# Patient Record
Sex: Male | Born: 1965 | Hispanic: No | Marital: Married | Smoking: Current every day smoker
Health system: Southern US, Community
[De-identification: ages and names within clinical notes are randomized; demographics above are authoritative.]

## PROBLEM LIST (undated history)

## (undated) DIAGNOSIS — Z9889 Other specified postprocedural states: Secondary | ICD-10-CM

## (undated) DIAGNOSIS — E7800 Pure hypercholesterolemia, unspecified: Secondary | ICD-10-CM

## (undated) DIAGNOSIS — T8859XA Other complications of anesthesia, initial encounter: Secondary | ICD-10-CM

## (undated) DIAGNOSIS — R112 Nausea with vomiting, unspecified: Secondary | ICD-10-CM

## (undated) DIAGNOSIS — M1990 Unspecified osteoarthritis, unspecified site: Secondary | ICD-10-CM

## (undated) DIAGNOSIS — N189 Chronic kidney disease, unspecified: Secondary | ICD-10-CM

## (undated) DIAGNOSIS — F32A Depression, unspecified: Secondary | ICD-10-CM

## (undated) DIAGNOSIS — F4310 Post-traumatic stress disorder, unspecified: Secondary | ICD-10-CM

## (undated) DIAGNOSIS — M109 Gout, unspecified: Secondary | ICD-10-CM

## (undated) DIAGNOSIS — R519 Headache, unspecified: Secondary | ICD-10-CM

## (undated) DIAGNOSIS — F419 Anxiety disorder, unspecified: Secondary | ICD-10-CM

## (undated) HISTORY — PX: KNEE ARTHROSCOPY: SUR90

## (undated) HISTORY — PX: COLONOSCOPY: SHX174

---

## 2020-03-21 ENCOUNTER — Other Ambulatory Visit: Payer: Self-pay | Admitting: Neurological Surgery

## 2020-03-21 DIAGNOSIS — M7130 Other bursal cyst, unspecified site: Secondary | ICD-10-CM

## 2020-03-28 ENCOUNTER — Encounter (HOSPITAL_COMMUNITY): Payer: Self-pay | Admitting: Neurological Surgery

## 2020-03-28 ENCOUNTER — Other Ambulatory Visit (HOSPITAL_COMMUNITY)
Admission: RE | Admit: 2020-03-28 | Discharge: 2020-03-28 | Disposition: A | Discharge status: Home / Self Care | Payer: No Typology Code available for payment source | Source: Ambulatory Visit | Attending: Neurological Surgery | Admitting: Neurological Surgery

## 2020-03-28 DIAGNOSIS — Z20822 Contact with and (suspected) exposure to covid-19: Secondary | ICD-10-CM | POA: Insufficient documentation

## 2020-03-28 DIAGNOSIS — Z01812 Encounter for preprocedural laboratory examination: Secondary | ICD-10-CM | POA: Insufficient documentation

## 2020-03-28 LAB — SARS CORONAVIRUS 2 (TAT 6-24 HRS): SARS Coronavirus 2: NEGATIVE

## 2020-03-28 NOTE — Progress Notes (Addendum)
Brandon Mcdaniel denies chest pain or shortness of breath. Patient was tested for Covid today and has been in quarantine since that time.  I review Brandon Mcdaniel's last office visit with his PCP- Dr. Lorre Munroe at [facility] in [place]. Brandon Mcdaniel elevated cholesterol and elevated triglycerides. Patient also has stage III Kidney disease.  PCp is following creatine numbers. Per Dr. Glendale Chard last office notes, Brandon Mcdaniel is taking Fenofribrate for elevated cholestrol, it is not on patients admission medication list.Brandon Mcdaniel said that he has been out for a while, I encouraged patient to call his PCP.  I instructed Brandon Mcdaniel to stop [name] and Multipe Vitamins and to not smoke within 24 hours of surgery.  I asked anesthesiology PA-C to review.

## 2020-03-31 ENCOUNTER — Ambulatory Visit (HOSPITAL_COMMUNITY): Payer: No Typology Code available for payment source

## 2020-03-31 ENCOUNTER — Ambulatory Visit (HOSPITAL_COMMUNITY)
Admission: RE | Admit: 2020-03-31 | Discharge: 2020-03-31 | Disposition: A | Discharge status: Home / Self Care | Payer: No Typology Code available for payment source | Attending: Neurological Surgery | Admitting: Neurological Surgery

## 2020-03-31 ENCOUNTER — Other Ambulatory Visit: Payer: Self-pay

## 2020-03-31 ENCOUNTER — Encounter (HOSPITAL_COMMUNITY): Payer: Self-pay | Admitting: Neurological Surgery

## 2020-03-31 ENCOUNTER — Ambulatory Visit (HOSPITAL_COMMUNITY): Payer: No Typology Code available for payment source | Admitting: Physician Assistant

## 2020-03-31 ENCOUNTER — Ambulatory Visit (HOSPITAL_COMMUNITY)
Admission: RE | Disposition: A | Discharge status: Home / Self Care | Payer: Self-pay | Source: Home / Self Care | Attending: Neurological Surgery

## 2020-03-31 ENCOUNTER — Ambulatory Visit (HOSPITAL_COMMUNITY): Payer: No Typology Code available for payment source | Attending: Neurological Surgery

## 2020-03-31 DIAGNOSIS — M5418 Radiculopathy, sacral and sacrococcygeal region: Secondary | ICD-10-CM | POA: Diagnosis not present

## 2020-03-31 DIAGNOSIS — M7138 Other bursal cyst, other site: Secondary | ICD-10-CM | POA: Insufficient documentation

## 2020-03-31 DIAGNOSIS — M109 Gout, unspecified: Secondary | ICD-10-CM | POA: Insufficient documentation

## 2020-03-31 DIAGNOSIS — Z7982 Long term (current) use of aspirin: Secondary | ICD-10-CM | POA: Insufficient documentation

## 2020-03-31 DIAGNOSIS — F17210 Nicotine dependence, cigarettes, uncomplicated: Secondary | ICD-10-CM | POA: Diagnosis not present

## 2020-03-31 DIAGNOSIS — M7130 Other bursal cyst, unspecified site: Secondary | ICD-10-CM

## 2020-03-31 DIAGNOSIS — Z419 Encounter for procedure for purposes other than remedying health state, unspecified: Secondary | ICD-10-CM

## 2020-03-31 HISTORY — DX: Depression, unspecified: F32.A

## 2020-03-31 HISTORY — PX: LUMBAR LAMINECTOMY/DECOMPRESSION MICRODISCECTOMY: SHX5026

## 2020-03-31 HISTORY — DX: Other specified postprocedural states: Z98.890

## 2020-03-31 HISTORY — DX: Other specified postprocedural states: R11.2

## 2020-03-31 HISTORY — DX: Pure hypercholesterolemia, unspecified: E78.00

## 2020-03-31 HISTORY — DX: Unspecified osteoarthritis, unspecified site: M19.90

## 2020-03-31 HISTORY — DX: Post-traumatic stress disorder, unspecified: F43.10

## 2020-03-31 HISTORY — DX: Gout, unspecified: M10.9

## 2020-03-31 HISTORY — DX: Anxiety disorder, unspecified: F41.9

## 2020-03-31 HISTORY — DX: Headache, unspecified: R51.9

## 2020-03-31 HISTORY — DX: Chronic kidney disease, unspecified: N18.9

## 2020-03-31 HISTORY — DX: Other complications of anesthesia, initial encounter: T88.59XA

## 2020-03-31 LAB — BASIC METABOLIC PANEL WITH GFR
Anion gap: 9 (ref 5–15)
BUN: 19 mg/dL (ref 6–20)
CO2: 21 mmol/L — ABNORMAL LOW (ref 22–32)
Calcium: 8.9 mg/dL (ref 8.9–10.3)
Chloride: 105 mmol/L (ref 98–111)
Creatinine, Ser: 1.37 mg/dL — ABNORMAL HIGH (ref 0.61–1.24)
GFR, Estimated: >60 mL/min
Glucose, Bld: 93 mg/dL (ref 70–99)
Potassium: 4 mmol/L (ref 3.5–5.1)
Sodium: 135 mmol/L (ref 135–145)

## 2020-03-31 LAB — CBC WITH DIFFERENTIAL/PLATELET
Abs Immature Granulocytes: 0.03 10*3/uL (ref 0.00–0.07)
Basophils Absolute: 0 10*3/uL (ref 0.0–0.1)
Basophils Relative: 0 %
Eosinophils Absolute: 0.2 10*3/uL (ref 0.0–0.5)
Eosinophils Relative: 2 %
HCT: 42.6 % (ref 39.0–52.0)
Hemoglobin: 14.5 g/dL (ref 13.0–17.0)
Immature Granulocytes: 0 %
Lymphocytes Relative: 25 %
Lymphs Abs: 2.1 10*3/uL (ref 0.7–4.0)
MCH: 32.3 pg (ref 26.0–34.0)
MCHC: 34 g/dL (ref 30.0–36.0)
MCV: 94.9 fL (ref 80.0–100.0)
Monocytes Absolute: 0.7 10*3/uL (ref 0.1–1.0)
Monocytes Relative: 9 %
Neutro Abs: 5.2 10*3/uL (ref 1.7–7.7)
Neutrophils Relative %: 64 %
Platelets: 191 10*3/uL (ref 150–400)
RBC: 4.49 MIL/uL (ref 4.22–5.81)
RDW: 12.6 % (ref 11.5–15.5)
WBC: 8.2 10*3/uL (ref 4.0–10.5)
nRBC: 0 % (ref 0.0–0.2)

## 2020-03-31 LAB — SURGICAL PCR SCREEN
MRSA, PCR: NEGATIVE
Staphylococcus aureus: NEGATIVE

## 2020-03-31 LAB — PROTIME-INR
INR: 1.1 (ref 0.8–1.2)
Prothrombin Time: 13.9 seconds (ref 11.4–15.2)

## 2020-03-31 SURGERY — LUMBAR LAMINECTOMY/DECOMPRESSION MICRODISCECTOMY 1 LEVEL
Anesthesia: General | Site: Back | Laterality: Left

## 2020-03-31 MED ORDER — ROCURONIUM BROMIDE 10 MG/ML (PF) SYRINGE
PREFILLED_SYRINGE | INTRAVENOUS | Status: DC | PRN
  Administered 2020-03-31: 60 mg via INTRAVENOUS

## 2020-03-31 MED ORDER — CHLORHEXIDINE GLUCONATE CLOTH 2 % EX PADS: 6.0000 | MEDICATED_PAD | Freq: Once | CUTANEOUS | Status: DC

## 2020-03-31 MED ORDER — PHENYLEPHRINE HCL-NACL 10-0.9 MG/250ML-% IV SOLN
INTRAVENOUS | Status: DC | PRN
  Administered 2020-03-31: 25 ug/min via INTRAVENOUS

## 2020-03-31 MED ORDER — BUPIVACAINE HCL (PF) 0.25 % IJ SOLN
INTRAMUSCULAR | Status: DC | PRN
  Administered 2020-03-31: 10 mL

## 2020-03-31 MED ORDER — CEFAZOLIN SODIUM-DEXTROSE 2-4 GM/100ML-% IV SOLN
2.0000 g | INTRAVENOUS | Status: AC
  Administered 2020-03-31: 2 g via INTRAVENOUS

## 2020-03-31 MED ORDER — ONDANSETRON HCL 4 MG/2ML IJ SOLN
INTRAMUSCULAR | Status: AC
  Filled 2020-03-31: qty 2

## 2020-03-31 MED ORDER — ONDANSETRON HCL 4 MG/2ML IJ SOLN
INTRAMUSCULAR | Status: DC | PRN
  Administered 2020-03-31: 4 mg via INTRAVENOUS

## 2020-03-31 MED ORDER — DEXAMETHASONE SODIUM PHOSPHATE 10 MG/ML IJ SOLN
10.0000 mg | Freq: Once | INTRAMUSCULAR | Status: AC
  Administered 2020-03-31: 10 mg via INTRAVENOUS
  Filled 2020-03-31: qty 1

## 2020-03-31 MED ORDER — THROMBIN 5000 UNITS EX KIT
PACK | CUTANEOUS | Status: AC
  Filled 2020-03-31: qty 2

## 2020-03-31 MED ORDER — MUPIROCIN 2 % EX OINT: 1.0000 "application " | TOPICAL_OINTMENT | Freq: Once | CUTANEOUS | Status: DC

## 2020-03-31 MED ORDER — EPHEDRINE 5 MG/ML INJ
INTRAVENOUS | Status: AC
  Filled 2020-03-31: qty 10

## 2020-03-31 MED ORDER — FENTANYL CITRATE (PF) 250 MCG/5ML IJ SOLN
INTRAMUSCULAR | Status: AC
  Filled 2020-03-31: qty 5

## 2020-03-31 MED ORDER — HEMOSTATIC AGENTS (NO CHARGE) OPTIME
TOPICAL | Status: DC | PRN
  Administered 2020-03-31: 1 via TOPICAL

## 2020-03-31 MED ORDER — OXYCODONE HCL 5 MG/5ML PO SOLN: 5.0000 mg | Freq: Once | ORAL | Status: DC | PRN

## 2020-03-31 MED ORDER — FENTANYL CITRATE (PF) 250 MCG/5ML IJ SOLN
INTRAMUSCULAR | Status: DC | PRN
  Administered 2020-03-31: 100 ug via INTRAVENOUS
  Administered 2020-03-31: 50 ug via INTRAVENOUS

## 2020-03-31 MED ORDER — LIDOCAINE 2% (20 MG/ML) 5 ML SYRINGE
INTRAMUSCULAR | Status: AC
  Filled 2020-03-31: qty 5

## 2020-03-31 MED ORDER — MIDAZOLAM HCL 2 MG/2ML IJ SOLN
INTRAMUSCULAR | Status: AC
  Filled 2020-03-31: qty 2

## 2020-03-31 MED ORDER — OXYCODONE HCL 5 MG PO TABS: 5.0000 mg | ORAL_TABLET | Freq: Once | ORAL | Status: DC | PRN

## 2020-03-31 MED ORDER — BUPIVACAINE HCL (PF) 0.25 % IJ SOLN
INTRAMUSCULAR | Status: AC
  Filled 2020-03-31: qty 30

## 2020-03-31 MED ORDER — DEXAMETHASONE SODIUM PHOSPHATE 10 MG/ML IJ SOLN
INTRAMUSCULAR | Status: AC
  Filled 2020-03-31: qty 1

## 2020-03-31 MED ORDER — LIDOCAINE 2% (20 MG/ML) 5 ML SYRINGE
INTRAMUSCULAR | Status: DC | PRN
  Administered 2020-03-31: 60 mg via INTRAVENOUS

## 2020-03-31 MED ORDER — GABAPENTIN 300 MG PO CAPS
300.0000 mg | ORAL_CAPSULE | ORAL | Status: AC
  Administered 2020-03-31: 300 mg via ORAL
  Filled 2020-03-31: qty 1

## 2020-03-31 MED ORDER — 0.9 % SODIUM CHLORIDE (POUR BTL) OPTIME
TOPICAL | Status: DC | PRN
  Administered 2020-03-31: 1000 mL

## 2020-03-31 MED ORDER — LACTATED RINGERS IV SOLN: INTRAVENOUS | Status: DC

## 2020-03-31 MED ORDER — THROMBIN 5000 UNITS EX SOLR
OROMUCOSAL | Status: DC | PRN
  Administered 2020-03-31: 5 mL via TOPICAL

## 2020-03-31 MED ORDER — HYDROCODONE-ACETAMINOPHEN 5-325 MG PO TABS: 1.0000 | ORAL_TABLET | ORAL | 0 refills | Status: AC | PRN

## 2020-03-31 MED ORDER — MIDAZOLAM HCL 2 MG/2ML IJ SOLN
INTRAMUSCULAR | Status: DC | PRN
  Administered 2020-03-31: 2 mg via INTRAVENOUS

## 2020-03-31 MED ORDER — THROMBIN (RECOMBINANT) 5000 UNITS EX SOLR
CUTANEOUS | Status: AC
  Filled 2020-03-31: qty 5000

## 2020-03-31 MED ORDER — FENTANYL CITRATE (PF) 100 MCG/2ML IJ SOLN: 25.0000 ug | INTRAMUSCULAR | Status: DC | PRN

## 2020-03-31 MED ORDER — EPHEDRINE SULFATE-NACL 50-0.9 MG/10ML-% IV SOSY
PREFILLED_SYRINGE | INTRAVENOUS | Status: DC | PRN
  Administered 2020-03-31: 10 mg via INTRAVENOUS

## 2020-03-31 MED ORDER — SUGAMMADEX SODIUM 200 MG/2ML IV SOLN
INTRAVENOUS | Status: DC | PRN
  Administered 2020-03-31: 200 mg via INTRAVENOUS

## 2020-03-31 MED ORDER — PROPOFOL 10 MG/ML IV BOLUS
INTRAVENOUS | Status: AC
  Filled 2020-03-31: qty 20

## 2020-03-31 MED ORDER — THROMBIN 5000 UNITS EX SOLR
CUTANEOUS | Status: DC | PRN
  Administered 2020-03-31 (×2): 5000 [IU] via TOPICAL

## 2020-03-31 MED ORDER — ACETAMINOPHEN 500 MG PO TABS
1000.0000 mg | ORAL_TABLET | ORAL | Status: AC
  Administered 2020-03-31: 1000 mg via ORAL
  Filled 2020-03-31: qty 2

## 2020-03-31 MED ORDER — PROPOFOL 10 MG/ML IV BOLUS
INTRAVENOUS | Status: DC | PRN
  Administered 2020-03-31: 200 mg via INTRAVENOUS

## 2020-03-31 MED ORDER — MUPIROCIN 2 % EX OINT
TOPICAL_OINTMENT | CUTANEOUS | Status: AC
  Filled 2020-03-31: qty 22

## 2020-03-31 MED ORDER — ORAL CARE MOUTH RINSE: 15.0000 mL | Freq: Once | OROMUCOSAL | Status: AC

## 2020-03-31 MED ORDER — ROCURONIUM BROMIDE 10 MG/ML (PF) SYRINGE
PREFILLED_SYRINGE | INTRAVENOUS | Status: AC
  Filled 2020-03-31: qty 10

## 2020-03-31 MED ORDER — PROMETHAZINE HCL 25 MG/ML IJ SOLN: 6.2500 mg | INTRAMUSCULAR | Status: DC | PRN

## 2020-03-31 MED ORDER — CHLORHEXIDINE GLUCONATE 0.12 % MT SOLN
15.0000 mL | Freq: Once | OROMUCOSAL | Status: AC
  Administered 2020-03-31: 15 mL via OROMUCOSAL

## 2020-03-31 MED ORDER — ALBUMIN HUMAN 5 % IV SOLN: INTRAVENOUS | Status: DC | PRN

## 2020-03-31 SURGICAL SUPPLY — 43 items
BAND RUBBER #18 3X1/16 STRL (MISCELLANEOUS) ×4 IMPLANT
BENZOIN TINCTURE PRP APPL 2/3 (GAUZE/BANDAGES/DRESSINGS) ×2 IMPLANT
BUR CARBIDE MATCH 3.0 (BURR) ×2 IMPLANT
CANISTER SUCT 3000ML PPV (MISCELLANEOUS) ×2 IMPLANT
CLSR STERI-STRIP ANTIMIC 1/2X4 (GAUZE/BANDAGES/DRESSINGS) ×2 IMPLANT
COVER WAND RF STERILE (DRAPES) ×2 IMPLANT
DERMABOND ADVANCED (GAUZE/BANDAGES/DRESSINGS) ×1
DERMABOND ADVANCED .7 DNX12 (GAUZE/BANDAGES/DRESSINGS) ×1 IMPLANT
DIFFUSER DRILL AIR PNEUMATIC (MISCELLANEOUS) IMPLANT
DRAPE LAPAROTOMY 100X72X124 (DRAPES) ×2 IMPLANT
DRAPE MICROSCOPE LEICA (MISCELLANEOUS) ×2 IMPLANT
DRAPE SURG 17X23 STRL (DRAPES) ×2 IMPLANT
DRSG OPSITE 4X5.5 SM (GAUZE/BANDAGES/DRESSINGS) ×2 IMPLANT
DRSG OPSITE POSTOP 4X6 (GAUZE/BANDAGES/DRESSINGS) ×2 IMPLANT
DURAPREP 26ML APPLICATOR (WOUND CARE) ×2 IMPLANT
ELECT REM PT RETURN 9FT ADLT (ELECTROSURGICAL) ×2
ELECTRODE REM PT RTRN 9FT ADLT (ELECTROSURGICAL) ×1 IMPLANT
GAUZE 4X4 16PLY RFD (DISPOSABLE) IMPLANT
GLOVE BIO SURGEON STRL SZ7 (GLOVE) ×2 IMPLANT
GLOVE BIO SURGEON STRL SZ8 (GLOVE) ×2 IMPLANT
GLOVE SURG UNDER POLY LF SZ7 (GLOVE) ×6 IMPLANT
GOWN STRL REUS W/ TWL LRG LVL3 (GOWN DISPOSABLE) ×1 IMPLANT
GOWN STRL REUS W/ TWL XL LVL3 (GOWN DISPOSABLE) ×1 IMPLANT
GOWN STRL REUS W/TWL 2XL LVL3 (GOWN DISPOSABLE) ×2 IMPLANT
GOWN STRL REUS W/TWL LRG LVL3 (GOWN DISPOSABLE) ×1
GOWN STRL REUS W/TWL XL LVL3 (GOWN DISPOSABLE) ×1
HEMOSTAT POWDER KIT SURGIFOAM (HEMOSTASIS) ×2 IMPLANT
KIT BASIN OR (CUSTOM PROCEDURE TRAY) ×2 IMPLANT
KIT TURNOVER KIT B (KITS) ×2 IMPLANT
NEEDLE HYPO 25X1 1.5 SAFETY (NEEDLE) ×2 IMPLANT
NEEDLE SPNL 20GX3.5 QUINCKE YW (NEEDLE) IMPLANT
NS IRRIG 1000ML POUR BTL (IV SOLUTION) ×2 IMPLANT
PACK LAMINECTOMY NEURO (CUSTOM PROCEDURE TRAY) ×2 IMPLANT
PAD ARMBOARD 7.5X6 YLW CONV (MISCELLANEOUS) ×6 IMPLANT
SPONGE SURGIFOAM ABS GEL SZ50 (HEMOSTASIS) ×2 IMPLANT
STRIP CLOSURE SKIN 1/2X4 (GAUZE/BANDAGES/DRESSINGS) ×2 IMPLANT
SUT VIC AB 0 CT1 18XCR BRD8 (SUTURE) ×1 IMPLANT
SUT VIC AB 0 CT1 8-18 (SUTURE) ×1
SUT VIC AB 2-0 CP2 18 (SUTURE) ×2 IMPLANT
SUT VIC AB 3-0 SH 8-18 (SUTURE) ×2 IMPLANT
TOWEL GREEN STERILE (TOWEL DISPOSABLE) ×2 IMPLANT
TOWEL GREEN STERILE FF (TOWEL DISPOSABLE) ×2 IMPLANT
WATER STERILE IRR 1000ML POUR (IV SOLUTION) ×2 IMPLANT

## 2020-03-31 NOTE — Anesthesia Procedure Notes (Signed)
Procedure Name: Intubation Date/Time: 03/31/2020 4:24 PM Performed by: Reece Agar, CRNA Pre-anesthesia Checklist: Patient identified, Emergency Drugs available, Suction available and Patient being monitored Patient Re-evaluated:Patient Re-evaluated prior to induction Oxygen Delivery Method: Circle System Utilized Preoxygenation: Pre-oxygenation with 100% oxygen Induction Type: IV induction Ventilation: Mask ventilation without difficulty Laryngoscope Size: Mac and 4 Grade View: Grade II Tube type: Oral Tube size: 7.5 mm Number of attempts: 1 Airway Equipment and Method: Stylet and Oral airway Placement Confirmation: ETT inserted through vocal cords under direct vision,  positive ETCO2 and breath sounds checked- equal and bilateral Secured at: 23 cm Tube secured with: Tape Dental Injury: Teeth and Oropharynx as per pre-operative assessment  Comments: Unsuccessful intubation by SRNA with Mil 3 & Mac 4. Successful intubation by CRNA with Mac 4

## 2020-03-31 NOTE — H&P (Signed)
Subjective: Patient is a 55 y.o. male admitted for left S1 radiculopathy. Onset of symptoms was several months ago, gradually worsening since that time.  The pain is rated severe, and is located at the left gluteal area and radiates to left leg. The pain is described as aching and occurs all day. The symptoms have been progressive. Symptoms are exacerbated by exercise. MRI or CT showed synovial cyst L5-S1 left compressing the left S1 nerve root  Past Medical History:  Diagnosis Date  . Anxiety   . Arthritis   . Chronic kidney disease    stage III kidney disease, PCP is monitoring  . Complication of anesthesia   . Depression   . Elevated cholesterol   . Gout   . Headache    "what I call a Mirgraine, a few times."  . PONV (postoperative nausea and vomiting)   . PTSD (post-traumatic stress disorder)     Past Surgical History:  Procedure Laterality Date  . COLONOSCOPY    . KNEE ARTHROSCOPY Right    "Scrapped out gout"    Prior to Admission medications   Medication Sig Start Date End Date Taking? Authorizing Provider  allopurinol (ZYLOPRIM) 100 MG tablet Take 100 mg by mouth daily.   Yes [provider]  Aspirin-Acetaminophen-Caffeine (GOODY HEADACHE PO) Take 1 Package by mouth 3 (three) times daily as needed.   Yes [provider]  mirtazapine (REMERON) 15 MG tablet Take 15 mg by mouth at bedtime. 02/14/20  Yes [provider]  sertraline (ZOLOFT) 50 MG tablet Take 50 mg by mouth daily. 02/20/20  Yes [provider]  Multiple Vitamins-Minerals (MULTIVITAMIN WITH MINERALS) tablet Take 1 tablet by mouth daily.    [provider]   Allergies  Allergen Reactions  . Shellfish Allergy Anaphylaxis    Shrimp    Social History   Tobacco Use  . Smoking status: Current Every Day Smoker    Packs/day: 1.00    Years: 15.00    Pack years: 15.00  . Smokeless tobacco: Never Used  Substance Use Topics  . Alcohol use: Never    History reviewed. No  pertinent family history.   Review of Systems  Positive ROS: neg  All other systems have been reviewed and were otherwise negative with the exception of those mentioned in the HPI and as above.  Objective: Vital signs in last 24 hours: Temp:  [97.8 F (36.6 C)] 97.8 F (36.6 C) (03/07 1336) Pulse Rate:  [72] 72 (03/07 1336) Resp:  [18] 18 (03/07 1336) BP: (131)/(88) 131/88 (03/07 1336) SpO2:  [96 %] 96 % (03/07 1336) Weight:  [97.5 kg] 97.5 kg (03/07 1336)  General Appearance: Alert, cooperative, no distress, appears stated age Head: Normocephalic, without obvious abnormality, atraumatic Eyes: PERRL, conjunctiva/corneas clear, EOM's intact    Neck: Supple, symmetrical, trachea midline Back: Symmetric, no curvature, ROM normal, no CVA tenderness Lungs:  respirations unlabored Heart: Regular rate and rhythm Abdomen: Soft, non-tender Extremities: Extremities normal, atraumatic, no cyanosis or edema Pulses: 2+ and symmetric all extremities Skin: Skin color, texture, turgor normal, no rashes or lesions  NEUROLOGIC:   Mental status: Alert and oriented x4,  no aphasia, good attention span, fund of knowledge, and memory Motor Exam - grossly normal Sensory Exam - grossly normal Reflexes: 1+ Coordination - grossly normal Gait - grossly normal Balance - grossly normal Cranial Nerves: I: smell Not tested  II: visual acuity  OS: nl    OD: nl  II: visual fields Full to confrontation  II: pupils Equal, round, reactive to light  III,VII: ptosis None  III,IV,VI: extraocular muscles  Full ROM  V: mastication Normal  V: facial light touch sensation  Normal  V,VII: corneal reflex  Present  VII: facial muscle function - upper  Normal  VII: facial muscle function - lower Normal  VIII: hearing Not tested  IX: soft palate elevation  Normal  IX,X: gag reflex Present  XI: trapezius strength  5/5  XI: sternocleidomastoid strength 5/5  XI: neck flexion strength  5/5  XII: tongue  strength  Normal    Data Review Lab Results  Component Value Date   WBC 8.2 03/31/2020   HGB 14.5 03/31/2020   HCT 42.6 03/31/2020   MCV 94.9 03/31/2020   PLT 191 03/31/2020   Lab Results  Component Value Date   NA 135 03/31/2020   K 4.0 03/31/2020   CL 105 03/31/2020   CO2 21 (L) 03/31/2020   BUN 19 03/31/2020   CREATININE 1.37 (H) 03/31/2020   GLUCOSE 93 03/31/2020   Lab Results  Component Value Date   INR 1.1 03/31/2020    Assessment/Plan:  Estimated body mass index is 27.6 kg/m as calculated from the following:   Height as of this encounter: 6\' 2"  (1.88 m).   Weight as of this encounter: 97.5 kg. Patient admitted for left L5-S1 hemilaminectomy for resection of synovial cyst. Patient has failed a reasonable attempt at conservative therapy.  I explained the condition and procedure to the patient and answered any questions.  Patient wishes to proceed with procedure as planned. Understands risks/ benefits and typical outcomes of procedure.   03/31/2020 3:43 PM

## 2020-03-31 NOTE — Op Note (Signed)
03/31/2020  5:30 PM  PATIENT:  Brandon Mcdaniel  55 y.o. 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 Imaging showed synovial cyst L5-S1 on the left compressing the left S1 nerve root. The patient tried conservative measures without relief. Pain was debilitating. Recommended left L5-S1 decompression with resection of synovial cyst. Patient understood the risks, benefits, and alternatives and potential outcomes and wished to proceed.  PROCEDURE DETAILS: The patient was taken to the operating room and after induction of adequate generalized endotracheal anesthesia, the patient was rolled into the prone position on the Wilson frame and all pressure points were padded. The lumbar region was cleaned and then prepped with DuraPrep and draped in the usual sterile fashion. 5 cc of local anesthesia was injected and then a dorsal midline incision was made and carried down to the lumbo sacral fascia. The fascia was opened and the paraspinous musculature was taken down in a subperiosteal fashion to expose L5-S1 on the left. Intraoperative x-ray confirmed my level, and then I used a combination of the high-speed drill and the Kerrison punches to perform a hemilaminectomy, medial facetectomy, and foraminotomy at L5-S1 on the left. The underlying yellow ligament was opened and removed in a piecemeal fashion to expose the underlying dura and exiting nerve root.  There was a  synovial cyst along the lateral edge of the dura on the left coming out of the joint.  I used a Statistician to dissected away from the lateral part of the dura.  I undercut the lateral recess and dissected down until I was medial to and distal to the pedicle. The nerve root was well decompressed. We then gently retracted the nerve root medially with a retractor, coagulated the epidural venous vasculature, and inspected the disc space, there is no disc herniation I then palpated with a coronary dilator along the nerve root and into the foramen to assure adequate decompression. I felt no more compression of the nerve root. I irrigated with saline solution containing bacitracin. Achieved hemostasis with bipolar cautery, lined the dura with Gelfoam, and then closed the fascia with 0 Vicryl. I closed the subcutaneous tissues with 2-0 Vicryl and the subcuticular tissues with 3-0 Vicryl. The skin was then closed with benzoin and Steri-Strips. The drapes were removed, a sterile dressing was applied.  My nurse practitioner was involved in the exposure, safe retraction of the neural elements, the disc work and the closure. the patient was awakened from general anesthesia and transferred to the recovery room in stable condition. At the end of the procedure all sponge, needle and instrument counts were correct.   PLAN OF CARE: Discharge to home after PACU  PATIENT DISPOSITION:  PACU - hemodynamically stable.   Delay start of Pharmacological VTE agent (>24hrs) due to surgical blood loss or risk of bleeding:  yes

## 2020-03-31 NOTE — Anesthesia Postprocedure Evaluation (Signed)
Anesthesia Post Note  Patient: Brandon Mcdaniel  Procedure(s) Performed: Laminectomy for facet/synovial cyst - Lumbar five-Sacral one - left (Left Back)     Patient location during evaluation: PACU Anesthesia Type: General Level of consciousness: awake Pain management: pain level controlled Vital Signs Assessment: post-procedure vital signs reviewed and stable Respiratory status: spontaneous breathing, nonlabored ventilation, respiratory function stable and patient connected to nasal cannula oxygen Cardiovascular status: blood pressure returned to baseline and stable Postop Assessment: no apparent nausea or vomiting Anesthetic complications: no   No complications documented.  Last Vitals:  Vitals:   03/31/20 1750 03/31/20 1805  BP: 109/73 127/81  Pulse: 82 71  Resp: 18 14  Temp:  (!) 36.3 C  SpO2: 98% 97%    Last Pain:  Vitals:   03/31/20 1805  TempSrc:   PainSc: 0-No pain                 Laraina Sulton P Foy Vanduyne

## 2020-03-31 NOTE — Transfer of Care (Signed)
Immediate Anesthesia Transfer of Care Note  Patient: Brandon Mcdaniel  Procedure(s) Performed: Laminectomy for facet/synovial cyst - Lumbar five-Sacral one - left (Left Back)  Patient Location: PACU  Anesthesia Type:General  Level of Consciousness: awake and alert   Airway & Oxygen Therapy: Patient Spontanous Breathing and Patient connected to face mask oxygen  Post-op Assessment: Report given to RN, Post -op Vital signs reviewed and stable and Patient moving all extremities X 4  Post vital signs: Reviewed and stable  Last Vitals:  Vitals Value Taken Time  BP 119/77 03/31/20 1735  Temp    Pulse 79 03/31/20 1740  Resp 25 03/31/20 1740  SpO2 98 % 03/31/20 1740  Vitals shown include unvalidated device data.  Last Pain:  Vitals:   03/31/20 1349  TempSrc:   PainSc: 0-No pain      Patients Stated Pain Goal: 3 (03/31/20 1349)  Complications: No complications documented.

## 2020-03-31 NOTE — Anesthesia Preprocedure Evaluation (Addendum)
Anesthesia Evaluation  Patient identified by MRN, date of birth, ID band Patient awake    Reviewed: Allergy & Precautions, NPO status , Patient's Chart, lab work & pertinent test results  History of Anesthesia Complications (+) PONV and history of anesthetic complications  Airway Mallampati: II  TM Distance: >3 FB Neck ROM: Full    Dental  (+) Chipped,    Pulmonary Current Smoker and Patient abstained from smoking.,    Pulmonary exam normal breath sounds clear to auscultation       Cardiovascular negative cardio ROS Normal cardiovascular exam Rhythm:Regular Rate:Normal  ECG: NSR, rate 71    Neuro/Psych  Headaches, PSYCHIATRIC DISORDERS Anxiety Depression PTSD (post-traumatic stress disorder)    GI/Hepatic negative GI ROS, Neg liver ROS,   Endo/Other  negative endocrine ROS  Renal/GU Renal disease     Musculoskeletal  (+) Arthritis , Gout   Abdominal   Peds  Hematology HLD   Anesthesia Other Findings Synovial cyst  Reproductive/Obstetrics                            Anesthesia Physical Anesthesia Plan  ASA: II  Anesthesia Plan: General   Post-op Pain Management:    Induction: Intravenous  PONV Risk Score and Plan: 2 and Ondansetron, Dexamethasone, Midazolam and Treatment may vary due to age or medical condition  Airway Management Planned: Oral ETT  Additional Equipment:   Intra-op Plan:   Post-operative Plan: Extubation in OR  Informed Consent: I have reviewed the patients History and Physical, chart, labs and discussed the procedure including the risks, benefits and alternatives for the proposed anesthesia with the patient or authorized representative who has indicated his/her understanding and acceptance.     Dental advisory given  Plan Discussed with: CRNA  Anesthesia Plan Comments:         Anesthesia Quick Evaluation

## 2020-04-01 ENCOUNTER — Encounter (HOSPITAL_COMMUNITY): Payer: Self-pay | Admitting: Neurological Surgery

## 2020-04-01 MED FILL — Thrombin (Recombinant) For Soln 5000 Unit: CUTANEOUS | Qty: 5000 | Status: AC

## 2021-07-17 IMAGING — CR DG LUMBAR SPINE 2-3V
2 series · 2 of 2 positions shown · non-contrast
Comparison: Lumbar MRI 10/05/2019.

CLINICAL DATA: Localization films for L5-S1 laminectomy.

EXAM:
LUMBAR SPINE - 2-3 VIEW

[xtable lateral (1 of 2)]
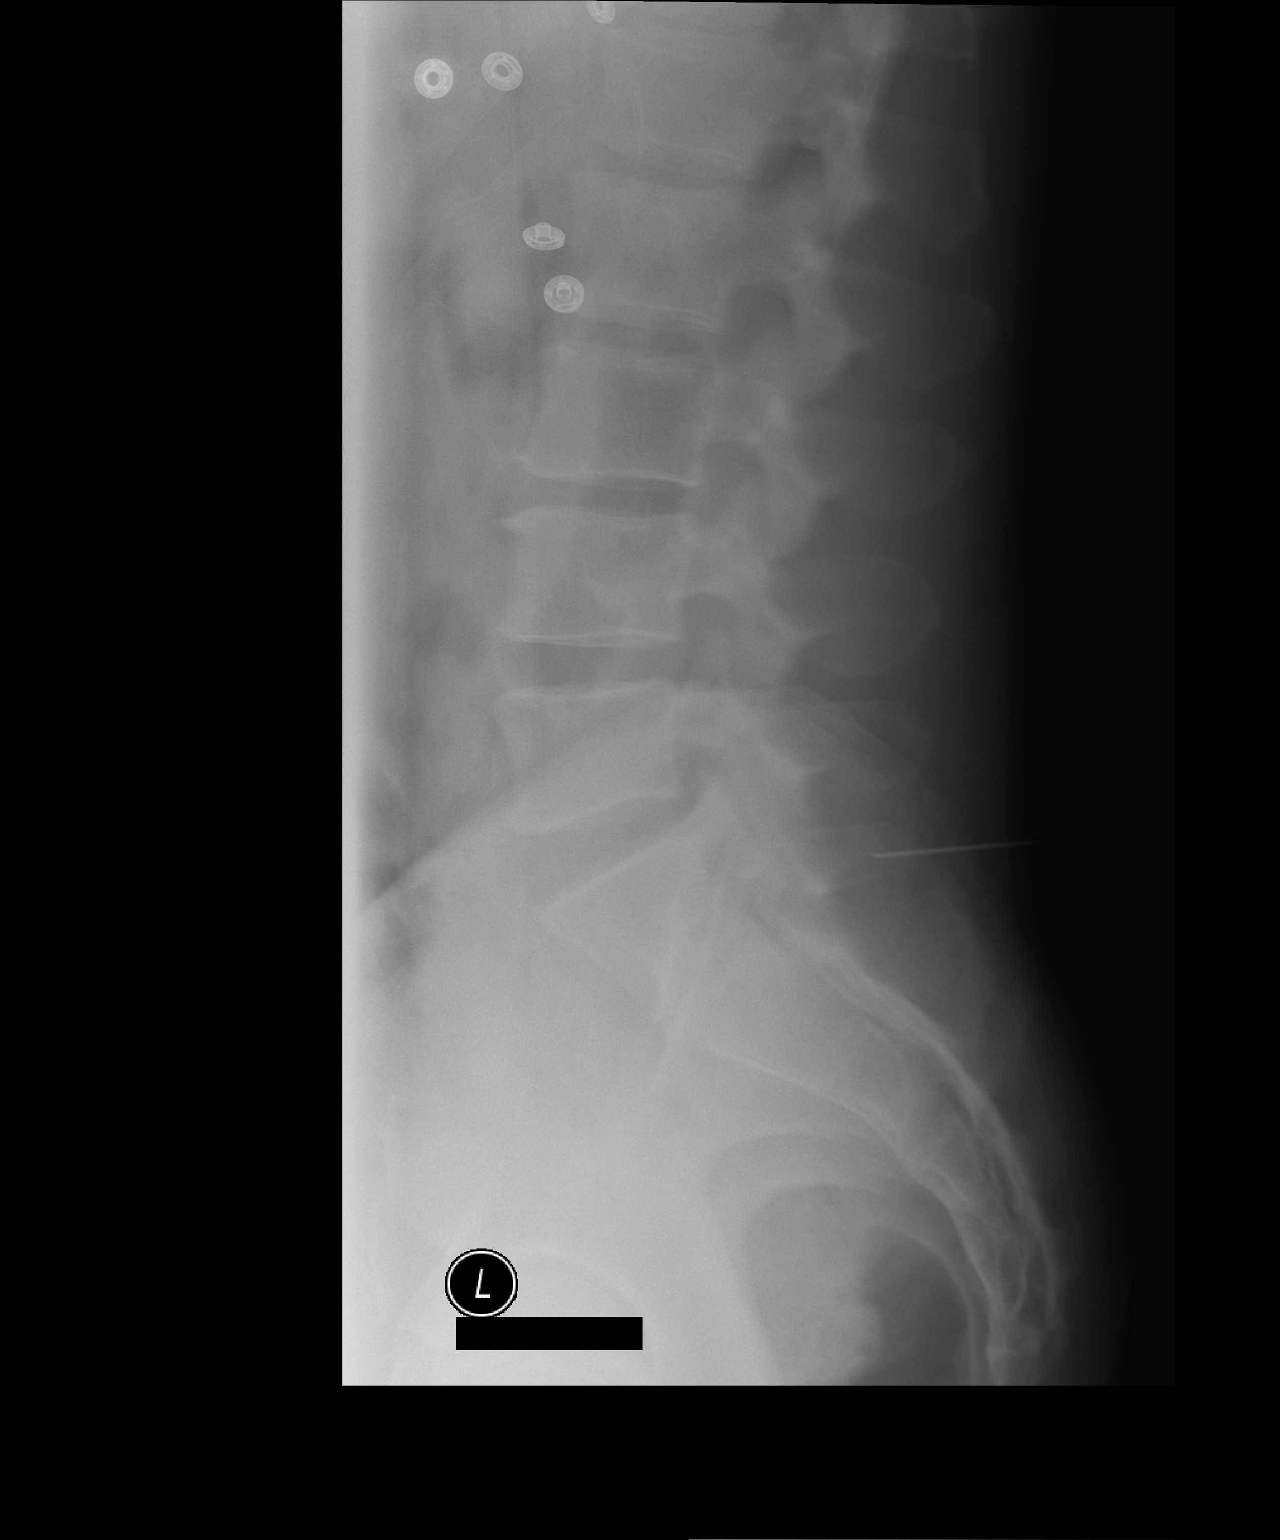

[xtable lateral (2 of 2)]
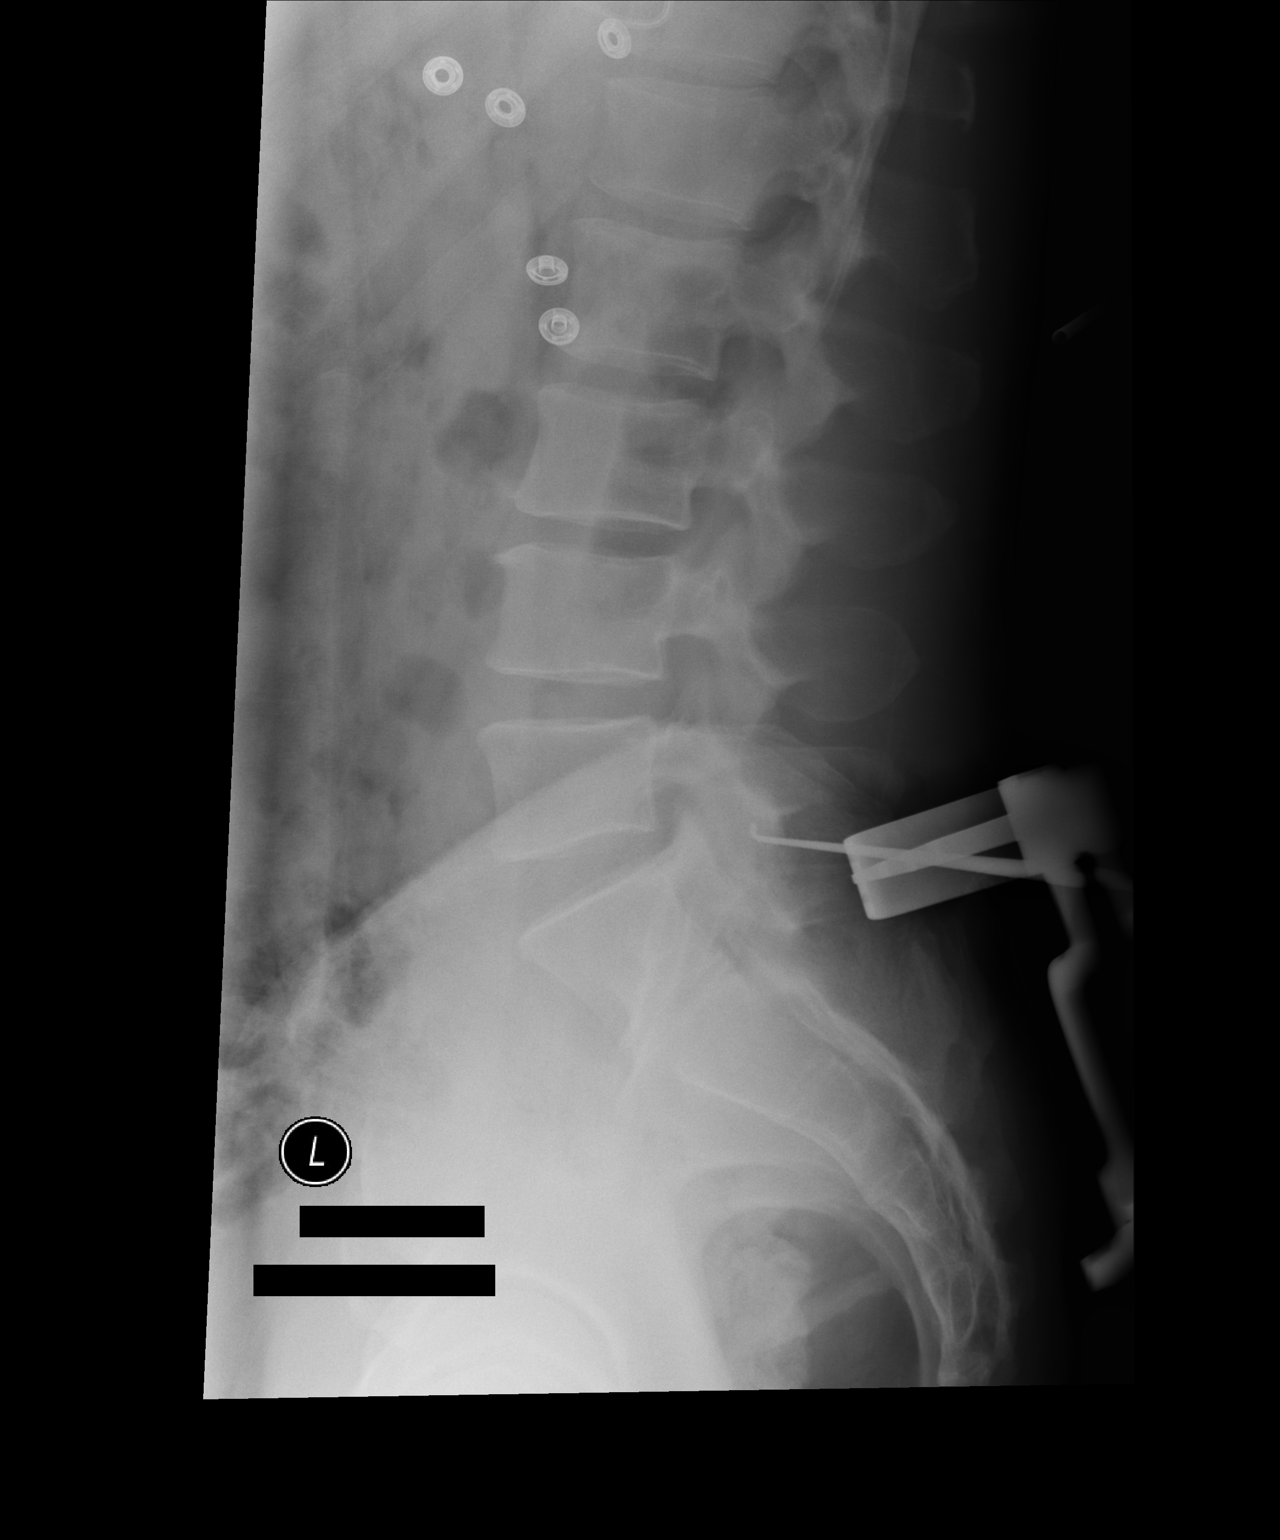

[2 of 2 positions shown; findings below may reference images not displayed]

FINDINGS: Two lateral spot views of the lumbar spine obtained in the operating
room. Transitional lumbosacral anatomy identified on prior lumbar
MRI. The lowest well-formed disc space will be labeled L5-S1.
Initial film at 5464 hour demonstrates surgical instrument
localizing posterior to S1 spinous process. Subsequent image timed
2489 demonstrates surgical instruments localizing to L5-S1 disc
space.
IMPRESSION: Lateral spot views of the lumbar spine with surgical instruments
localizing to S1 and than L5-S1.

## 2021-07-17 IMAGING — CR DG CHEST 2V
2 series · 2 of 2 positions shown · non-contrast
Comparison: None.

CLINICAL DATA: Synovial cyst.

EXAM:
CHEST - 2 VIEW

[w chest pa]
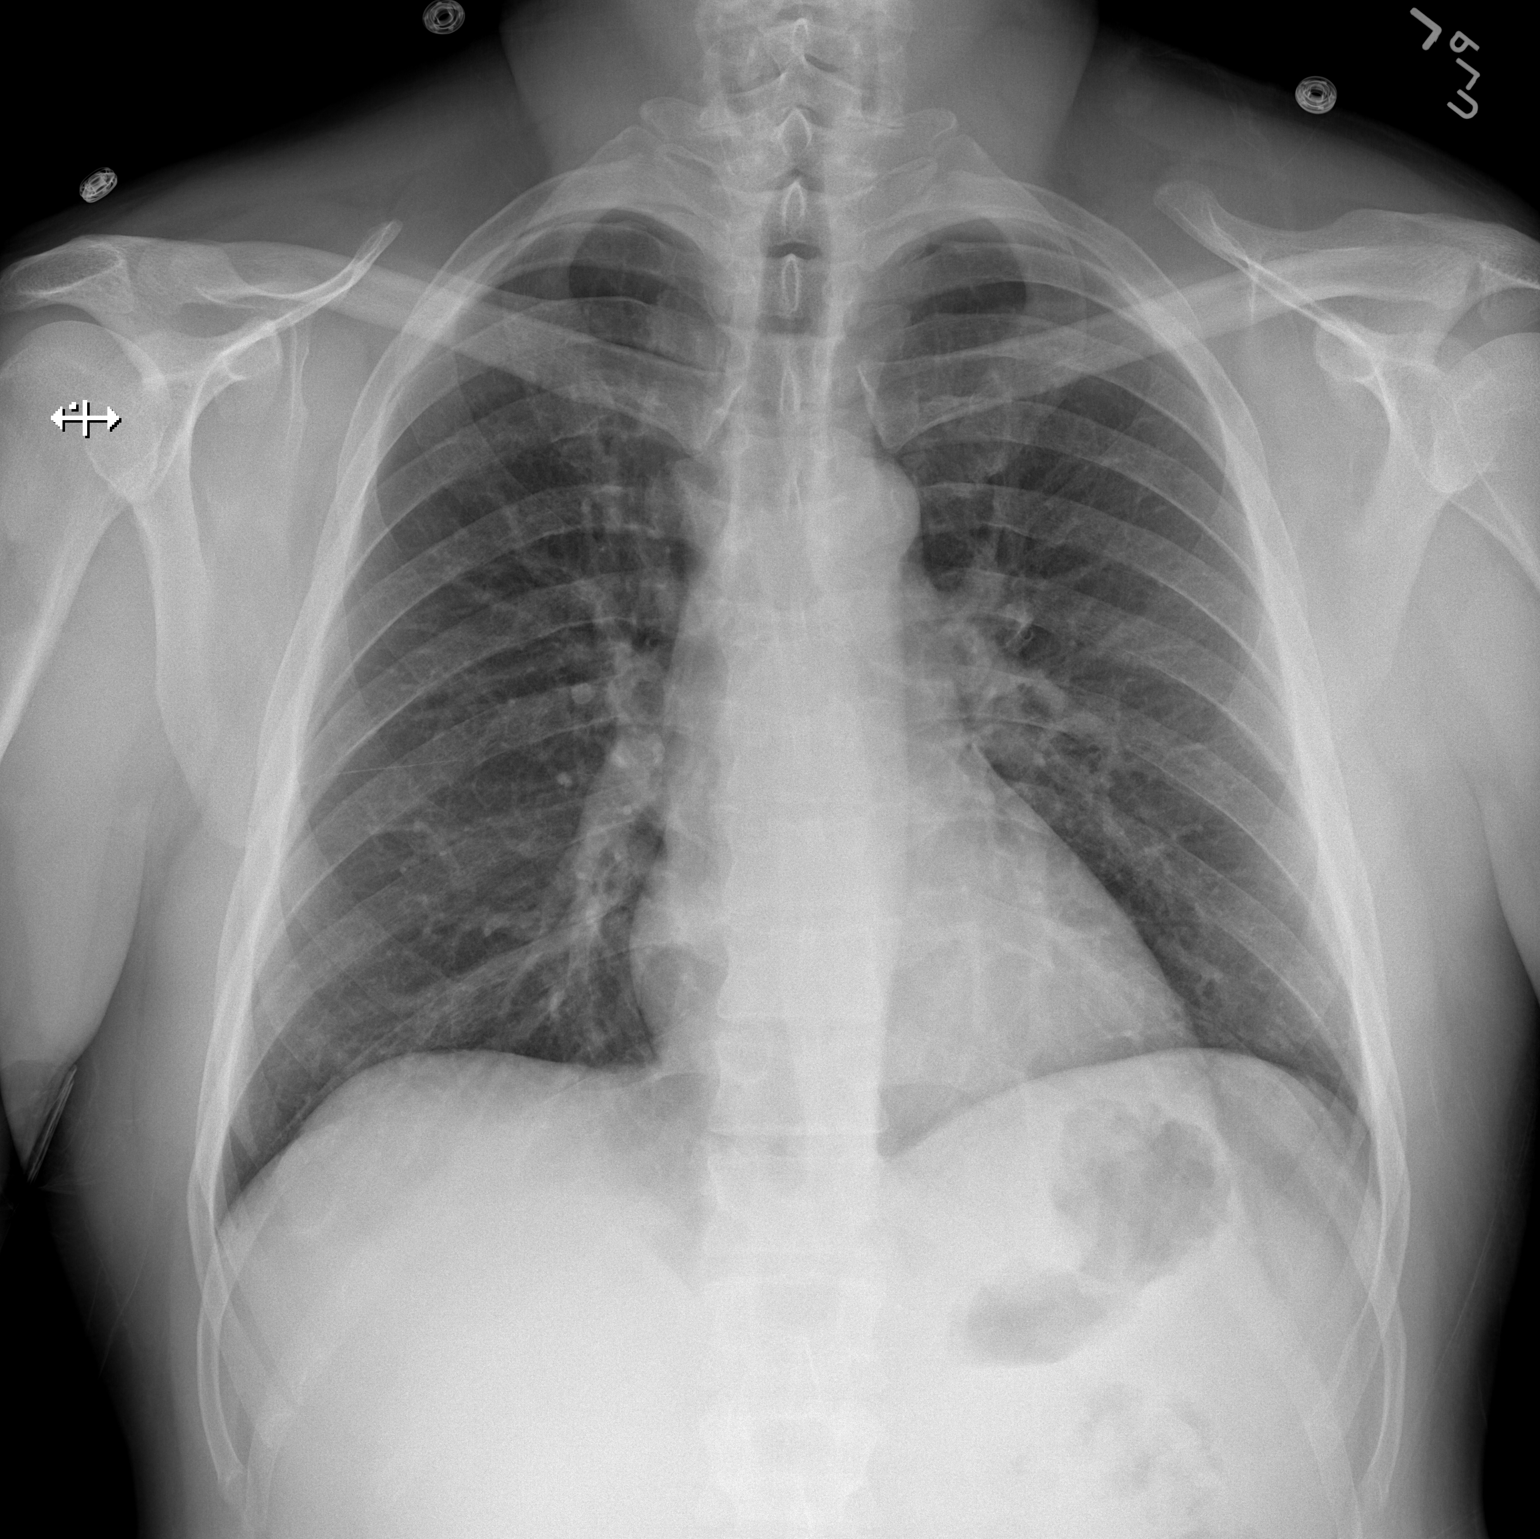

[w chest lat]
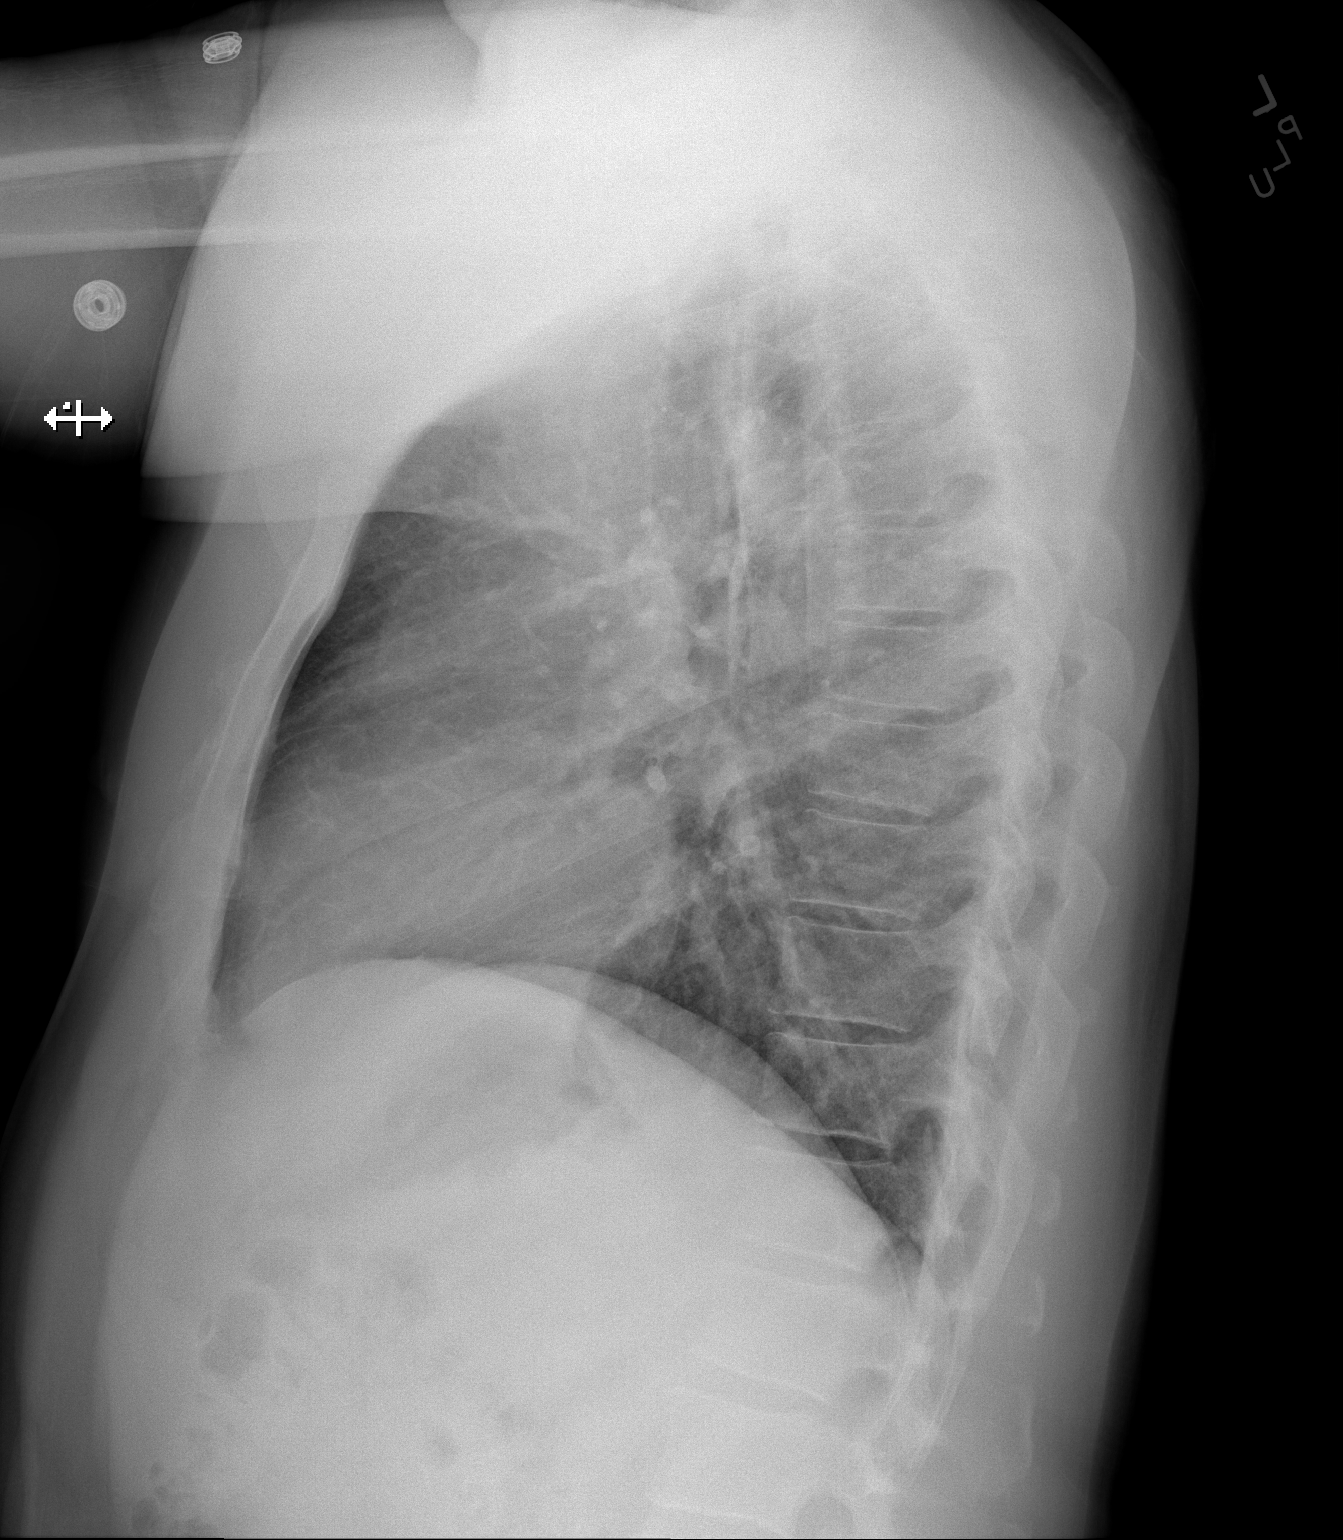

[2 of 2 positions shown; findings below may reference images not displayed]

FINDINGS: The heart size and mediastinal contours are within normal limits.
Both lungs are clear. The visualized skeletal structures are
unremarkable.
IMPRESSION: No active cardiopulmonary disease.
